# Patient Record
Sex: Male | Born: 1995 | Race: Black or African American | Hispanic: No | Marital: Single | Smoking: Never smoker
Health system: Southern US, Community
[De-identification: ages and names within clinical notes are randomized; demographics above are authoritative.]

---
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
## 2015-04-13 IMAGING — CR DG CHEST 2V
2 series · 2 of 2 positions shown · non-contrast
Comparison: Chest x-ray 06/07/2007.

CLINICAL DATA: Chest pain and shortness of breath.

EXAM:
CHEST  2 VIEW

[w chest pa]
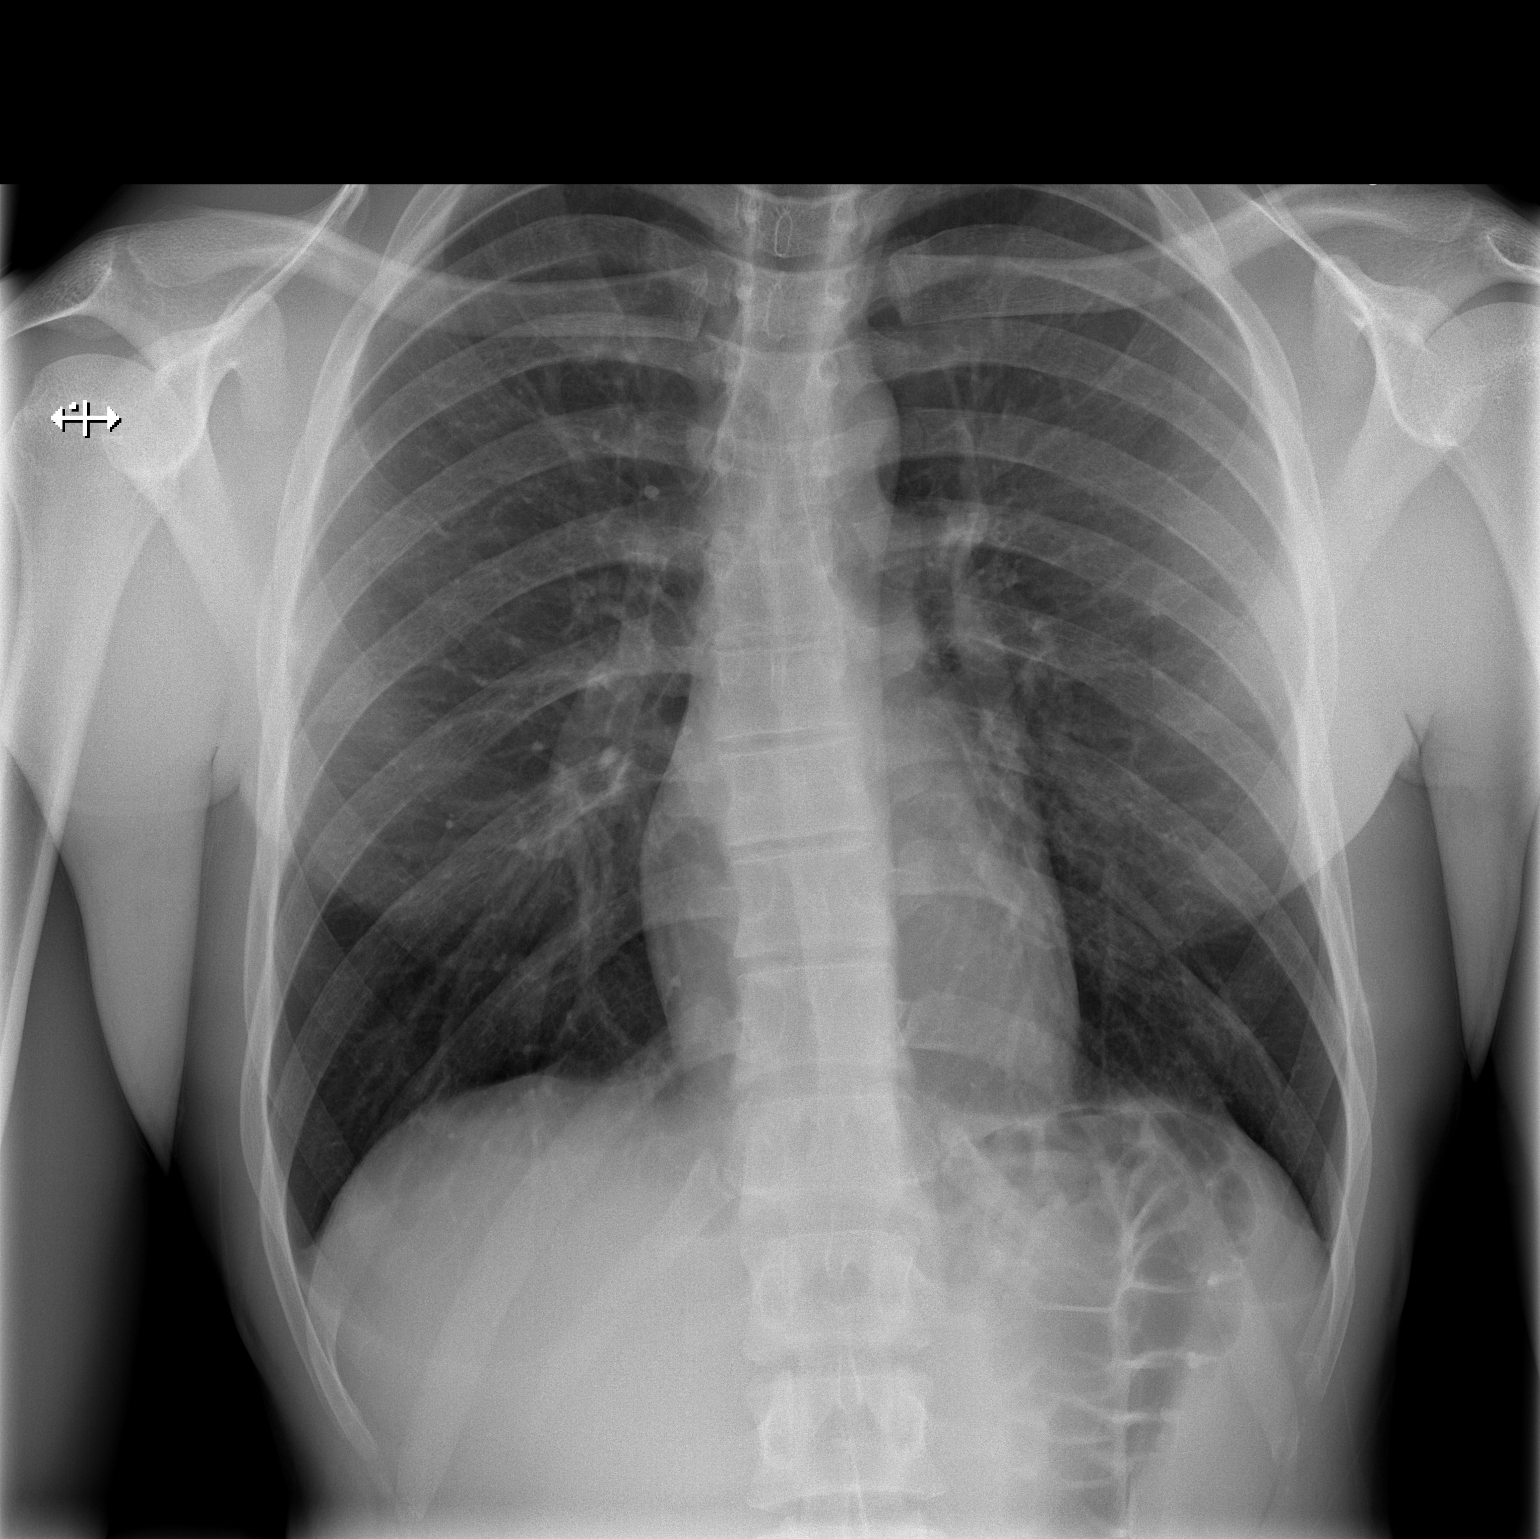

[w chest lat]
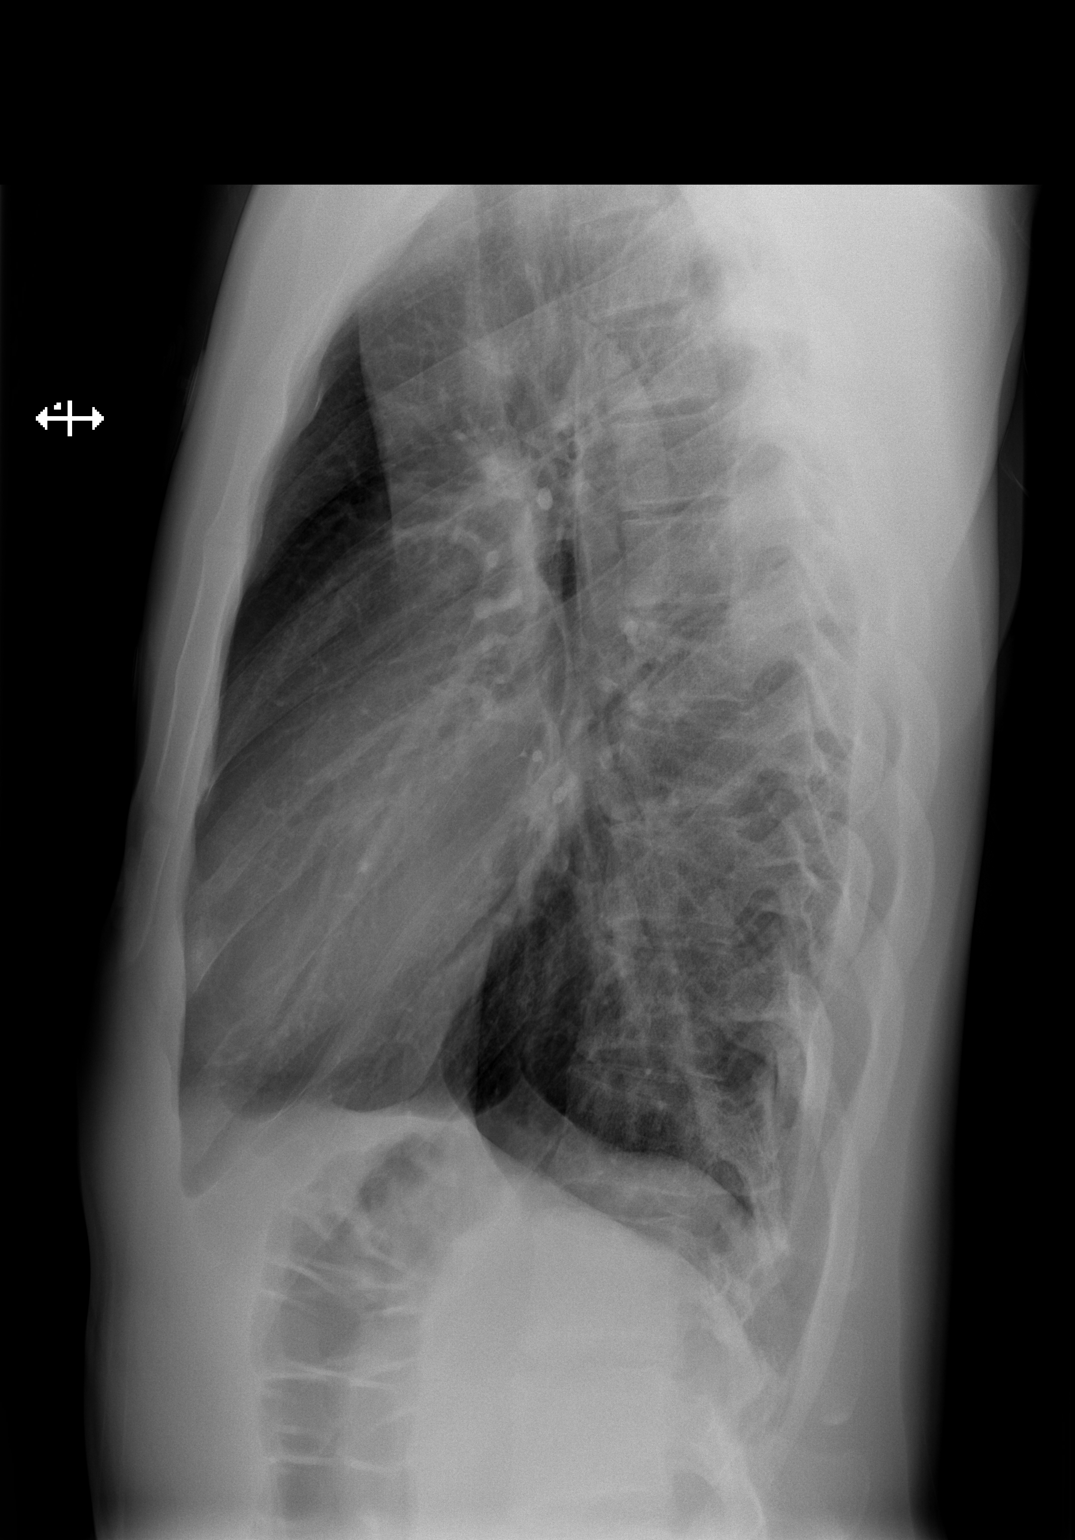

[2 of 2 positions shown; findings below may reference images not displayed]

FINDINGS: Lung volumes are normal. No consolidative airspace disease. No
pleural effusions. No pneumothorax. No pulmonary nodule or mass
noted. Pulmonary vasculature and the cardiomediastinal silhouette
are within normal limits.
IMPRESSION: 1.  No radiographic evidence of acute cardiopulmonary disease.

## 2015-10-23 ENCOUNTER — Ambulatory Visit (INDEPENDENT_AMBULATORY_CARE_PROVIDER_SITE_OTHER): Payer: Self-pay | Admitting: Urgent Care

## 2015-10-23 VITALS — BP 122/80 | HR 66 | Temp 98.2°F | Resp 18 | Ht 72.0 in | Wt 165.0 lb

## 2015-10-23 DIAGNOSIS — Z0000 Encounter for general adult medical examination without abnormal findings: Secondary | ICD-10-CM

## 2015-10-23 NOTE — Progress Notes (Signed)
    MRN: [id number] DOB: 1995-10-21  Subjective:   Sergio Walker is a 20 y.o. male presenting for chief complaint of Annual Exam  Patient is a Management consultantprofessional cage fighter. He presents today to get medically cleared for this. Admits history of left hand fracture and surgery without sequelae. He has also had a right ear cholesteatoma removed.  Sergio Walker has a current medication list which includes the following prescription(s): guaifenesin and ibuprofen. Also has No Known Allergies.  Sergio Walker  has no past medical history on file. Also  has no past surgical history on file.  Review of Systems  Constitutional: Negative for fever, chills, weight loss, malaise/fatigue and diaphoresis.  HENT: Negative for congestion, ear discharge, ear pain, hearing loss, nosebleeds, sore throat and tinnitus.   Eyes: Negative for blurred vision, double vision, photophobia, pain, discharge and redness.  Respiratory: Negative for cough, shortness of breath and wheezing.   Cardiovascular: Negative for chest pain, palpitations and leg swelling.  Gastrointestinal: Negative for nausea, vomiting, abdominal pain, diarrhea, constipation and blood in stool.  Genitourinary: Negative for dysuria, urgency, frequency, hematuria and flank pain.  Musculoskeletal: Negative for myalgias, back pain and joint pain.  Skin: Negative for itching and rash.  Neurological: Negative for dizziness, tingling, seizures, loss of consciousness, weakness and headaches.  Endo/Heme/Allergies: Negative for polydipsia.  Psychiatric/Behavioral: Negative for depression, suicidal ideas, hallucinations, memory loss and substance abuse. The patient is not nervous/anxious and does not have insomnia.    Objective:   Vitals: BP 122/80 mmHg  Pulse 66  Temp(Src) 98.2 F (36.8 C) (Oral)  Resp 18  Ht 6' (1.829 m)  Wt 165 lb (74.844 kg)  BMI 22.37 kg/m2  SpO2 98%  Physical Exam  Constitutional: He is oriented to person, place, and time. He appears  well-developed and well-nourished.  HENT:  TM's intact bilaterally, no effusions or erythema. Nasal turbinates pink and moist, nasal passages patent. No sinus tenderness. Oropharynx clear, mucous membranes moist, dentition in good repair.  Eyes: Conjunctivae and EOM are normal. Pupils are equal, round, and reactive to light. Right eye exhibits no discharge. Left eye exhibits no discharge. No scleral icterus.  Neck: Normal range of motion. Neck supple. No thyromegaly present.  Cardiovascular: Normal rate, regular rhythm and intact distal pulses.  Exam reveals no gallop and no friction rub.   No murmur heard. Pulmonary/Chest: No stridor. No respiratory distress. He has no wheezes. He has no rales.  Abdominal: Soft. Bowel sounds are normal. He exhibits no distension and no mass. There is no tenderness.  Musculoskeletal: Normal range of motion. He exhibits no edema or tenderness.  Strength 5/5.  Lymphadenopathy:    He has no cervical adenopathy.  Neurological: He is alert and oriented to person, place, and time. He has normal reflexes. Coordination normal.  Skin: Skin is warm and dry. No rash noted. No erythema. No pallor.  Psychiatric: He has a normal mood and affect.   Assessment and Plan :   1. Physical exam - Medically cleared for cage fighting. RTC as needed.  Wallis BambergMario Charde Macfarlane, PA-C [facility] [phone] 10/23/2015 2:22 PM

## 2015-10-23 NOTE — Patient Instructions (Addendum)
   IF you received an x-ray today, you will receive an invoice from [facility]. Please contact [facility] at [phone] with questions or concerns regarding your invoice.   IF you received labwork today, you will receive an invoice from [facility]. Please contact [facility] at [phone] with questions or concerns regarding your invoice.   Our billing staff will not be able to assist you with questions regarding bills from these companies.  You will be contacted with the lab results as soon as they are available. The fastest way to get your results is to activate your My Chart account. Instructions are located on the last page of this paperwork. If you have not heard from us regarding the results in 2 weeks, please contact this office.    Keeping you healthy  Get these tests  Blood pressure- Have your blood pressure checked once a year by your healthcare provider.  Normal blood pressure is 120/80.  Weight- Have your body mass index (BMI) calculated to screen for obesity.  BMI is a measure of body fat based on height and weight. You can also calculate your own BMI at [email or web address].com/bmi/.  Cholesterol- Have your cholesterol checked regularly starting at age 35, sooner may be necessary if you have diabetes, high blood pressure, if a family member developed heart diseases at an early age or if you smoke.   Chlamydia, HIV, and other sexual transmitted disease- Get screened each year until the age of 25 then within three months of each new sexual partner.  Diabetes- Have your blood sugar checked regularly if you have high blood pressure, high cholesterol, a family history of diabetes or if you are overweight.  Get these vaccines  Flu shot- Every fall.  Tetanus shot- Every 10 years.  Menactra- Single dose; prevents meningitis.  Take these steps  Don't smoke- If you do smoke, ask your healthcare provider about quitting. For tips on  how to quit, go to www.smokefree.gov or call 1-800-QUIT-NOW.  Be physically active- Exercise 5 days a week for at least 30 minutes.  If you are not already physically active start slow and gradually work up to 30 minutes of moderate physical activity.  Examples of moderate activity include walking briskly, mowing the yard, dancing, swimming bicycling, etc.  Eat a healthy diet- Eat a variety of healthy foods such as fruits, vegetables, low fat milk, low fat cheese, yogurt, lean meats, poultry, fish, beans, tofu, etc.  For more information on healthy eating, go to www.thenutritionsource.org  Drink alcohol in moderation- Limit alcohol intake two drinks or less a day.  Never drink and drive.  Dentist- Brush and floss teeth twice daily; visit your dentis twice a year.  Depression-Your emotional health is as important as your physical health.  If you're feeling down, losing interest in things you normally enjoy please talk with your healthcare provider.  Gun Safety- If you keep a gun in your home, keep it unloaded and with the safety lock on.  Bullets should be stored separately.  Helmet use- Always wear a helmet when riding a motorcycle, bicycle, rollerblading or skateboarding.  Safe sex- If you may be exposed to a sexually transmitted infection, use a condom  Seat belts- Seat bels can save your life; always wear one.  Smoke/Carbon Monoxide detectors- These detectors need to be installed on the appropriate level of your home.  Replace batteries at least once a year.  Skin Cancer- When out in the sun, cover up and use sunscreen SPF   15 or higher.  Violence- If anyone is threatening or hurting you, please tell your healthcare provider.
